# Patient Record
Sex: Female | Born: 1935 | Race: White | Hispanic: No | Marital: Married
Health system: Southern US, Community
[De-identification: ages and names within clinical notes are randomized; demographics above are authoritative.]

---

## 2004-06-28 ENCOUNTER — Ambulatory Visit: Payer: Self-pay | Admitting: Internal Medicine

## 2013-06-05 ENCOUNTER — Ambulatory Visit: Payer: Self-pay | Admitting: Ophthalmology

## 2013-07-03 ENCOUNTER — Ambulatory Visit: Payer: Self-pay | Admitting: Ophthalmology

## 2013-07-16 ENCOUNTER — Inpatient Hospital Stay: Payer: Self-pay | Admitting: Family Medicine

## 2013-07-16 DIAGNOSIS — I214 Non-ST elevation (NSTEMI) myocardial infarction: Secondary | ICD-10-CM

## 2013-07-16 LAB — URINALYSIS, COMPLETE
Bilirubin,UR: NEGATIVE
Ketone: NEGATIVE
Leukocyte Esterase: NEGATIVE
Nitrite: NEGATIVE
Ph: 5 (ref 4.5–8.0)
Specific Gravity: 1.018 (ref 1.003–1.030)
Squamous Epithelial: <1
WBC UR: 3 /HPF (ref 0–5)

## 2013-07-16 LAB — BASIC METABOLIC PANEL
BUN: 26 mg/dL — ABNORMAL HIGH (ref 7–18)
Calcium, Total: 8.9 mg/dL (ref 8.5–10.1)
Creatinine: 1.16 mg/dL (ref 0.60–1.30)
EGFR (African American): 51 — ABNORMAL LOW
Glucose: 111 mg/dL — ABNORMAL HIGH (ref 65–99)
Osmolality: 277 (ref 275–301)
Sodium: 136 mmol/L (ref 136–145)

## 2013-07-16 LAB — CBC WITH DIFFERENTIAL/PLATELET
Basophil #: 0.1 10*3/uL (ref 0.0–0.1)
Basophil %: 1.3 %
Eosinophil #: 0 10*3/uL (ref 0.0–0.7)
Eosinophil %: 0.1 %
HCT: 41.4 % (ref 35.0–47.0)
HGB: 13.8 g/dL (ref 12.0–16.0)
Lymphocyte #: 2.2 10*3/uL (ref 1.0–3.6)
Lymphocyte %: 31.2 %
MCH: 30.6 pg (ref 26.0–34.0)
MCHC: 33.3 g/dL (ref 32.0–36.0)
MCV: 92 fL (ref 80–100)
Monocyte #: 0.8 x10 3/mm (ref 0.2–0.9)
Neutrophil %: 56.5 %
RDW: 14 % (ref 11.5–14.5)
WBC: 7.1 10*3/uL (ref 3.6–11.0)

## 2013-07-16 LAB — CK: CK, Total: 5449 U/L — ABNORMAL HIGH (ref 21–215)

## 2013-07-16 LAB — CK-MB: CK-MB: 32.4 ng/mL — ABNORMAL HIGH (ref 0.5–3.6)

## 2013-07-16 LAB — RAPID INFLUENZA A&B ANTIGENS

## 2013-07-16 LAB — TROPONIN I: Troponin-I: 14 ng/mL — ABNORMAL HIGH

## 2013-07-16 LAB — APTT: Activated PTT: 35.2 secs (ref 23.6–35.9)

## 2013-07-17 LAB — URINALYSIS, COMPLETE
Bilirubin,UR: NEGATIVE
Glucose,UR: NEGATIVE mg/dL (ref 0–75)
Ketone: NEGATIVE
Protein: 30
RBC,UR: 2 /HPF (ref 0–5)
WBC UR: 15 /HPF (ref 0–5)

## 2013-07-17 LAB — CBC WITH DIFFERENTIAL/PLATELET
Basophil #: 0 10*3/uL (ref 0.0–0.1)
Eosinophil #: 0 10*3/uL (ref 0.0–0.7)
Eosinophil %: 0.4 %
HGB: 11.4 g/dL — ABNORMAL LOW (ref 12.0–16.0)
Lymphocyte %: 27.1 %
MCH: 31.2 pg (ref 26.0–34.0)
MCHC: 33.9 g/dL (ref 32.0–36.0)
MCV: 92 fL (ref 80–100)
Monocyte %: 10.7 %
Neutrophil #: 4.1 10*3/uL (ref 1.4–6.5)
Neutrophil %: 61.1 %
Platelet: 255 10*3/uL (ref 150–440)
RDW: 13.9 % (ref 11.5–14.5)
WBC: 6.7 10*3/uL (ref 3.6–11.0)

## 2013-07-17 LAB — APTT
Activated PTT: >160 secs (ref 23.6–35.9)
Activated PTT: 155 secs — ABNORMAL HIGH (ref 23.6–35.9)
Activated PTT: 112.7 secs — ABNORMAL HIGH (ref 23.6–35.9)

## 2013-07-17 LAB — LIPID PANEL
Cholesterol: 117 mg/dL (ref 0–200)
HDL Cholesterol: 34 mg/dL — ABNORMAL LOW (ref 40–60)

## 2013-07-17 LAB — COMPREHENSIVE METABOLIC PANEL
BUN: 25 mg/dL — ABNORMAL HIGH (ref 7–18)
Bilirubin,Total: 0.3 mg/dL (ref 0.2–1.0)
Chloride: 103 mmol/L (ref 98–107)
Co2: 27 mmol/L (ref 21–32)
Creatinine: 0.92 mg/dL (ref 0.60–1.30)
Glucose: 119 mg/dL — ABNORMAL HIGH (ref 65–99)
Osmolality: 276 (ref 275–301)
Potassium: 3.5 mmol/L (ref 3.5–5.1)
SGOT(AST): 207 U/L — ABNORMAL HIGH (ref 15–37)
SGPT (ALT): 66 U/L (ref 12–78)
Sodium: 135 mmol/L — ABNORMAL LOW (ref 136–145)
Total Protein: 5.6 g/dL — ABNORMAL LOW (ref 6.4–8.2)

## 2013-07-17 LAB — TSH: Thyroid Stimulating Horm: 1 u[IU]/mL

## 2013-07-17 LAB — CK-MB: CK-MB: 26.5 ng/mL — ABNORMAL HIGH (ref 0.5–3.6)

## 2013-07-17 LAB — TROPONIN I: Troponin-I: 9.9 ng/mL — ABNORMAL HIGH

## 2013-07-18 DIAGNOSIS — I214 Non-ST elevation (NSTEMI) myocardial infarction: Secondary | ICD-10-CM

## 2013-07-18 DIAGNOSIS — I10 Essential (primary) hypertension: Secondary | ICD-10-CM

## 2013-07-18 DIAGNOSIS — I359 Nonrheumatic aortic valve disorder, unspecified: Secondary | ICD-10-CM

## 2013-07-18 LAB — BASIC METABOLIC PANEL
Anion Gap: 3 — ABNORMAL LOW (ref 7–16)
BUN: 20 mg/dL — ABNORMAL HIGH (ref 7–18)
Calcium, Total: 7.8 mg/dL — ABNORMAL LOW (ref 8.5–10.1)
Chloride: 105 mmol/L (ref 98–107)
Co2: 27 mmol/L (ref 21–32)
Creatinine: 0.78 mg/dL (ref 0.60–1.30)
EGFR (African American): >60
EGFR (Non-African Amer.): >60
Glucose: 118 mg/dL — ABNORMAL HIGH (ref 65–99)
Osmolality: 274 (ref 275–301)
Potassium: 3.9 mmol/L (ref 3.5–5.1)
Sodium: 135 mmol/L — ABNORMAL LOW (ref 136–145)

## 2013-07-18 LAB — URINE CULTURE

## 2013-07-18 LAB — APTT: Activated PTT: 106.4 secs — ABNORMAL HIGH (ref 23.6–35.9)

## 2013-07-18 LAB — CK: CK, Total: 1734 U/L — ABNORMAL HIGH (ref 21–215)

## 2013-07-18 LAB — TROPONIN I: Troponin-I: 2.4 ng/mL — ABNORMAL HIGH

## 2013-07-19 ENCOUNTER — Telehealth: Payer: Self-pay

## 2013-07-19 LAB — CBC WITH DIFFERENTIAL/PLATELET
BASOS ABS: 0 10*3/uL (ref 0.0–0.1)
Basophil %: 0.6 %
Eosinophil #: 0.1 10*3/uL (ref 0.0–0.7)
Eosinophil %: 1.8 %
HCT: 29.7 % — ABNORMAL LOW (ref 35.0–47.0)
HGB: 9.9 g/dL — AB (ref 12.0–16.0)
LYMPHS ABS: 2 10*3/uL (ref 1.0–3.6)
Lymphocyte %: 32.2 %
MCH: 30.7 pg (ref 26.0–34.0)
MCHC: 33.4 g/dL (ref 32.0–36.0)
MCV: 92 fL (ref 80–100)
MONOS PCT: 10.1 %
Monocyte #: 0.6 x10 3/mm (ref 0.2–0.9)
Neutrophil #: 3.5 10*3/uL (ref 1.4–6.5)
Neutrophil %: 55.3 %
Platelet: 225 10*3/uL (ref 150–440)
RBC: 3.24 10*6/uL — ABNORMAL LOW (ref 3.80–5.20)
RDW: 13.8 % (ref 11.5–14.5)
WBC: 6.3 10*3/uL (ref 3.6–11.0)

## 2013-07-19 LAB — BASIC METABOLIC PANEL
Anion Gap: 3 — ABNORMAL LOW (ref 7–16)
BUN: 17 mg/dL (ref 7–18)
CALCIUM: 7.8 mg/dL — AB (ref 8.5–10.1)
CO2: 30 mmol/L (ref 21–32)
Chloride: 105 mmol/L (ref 98–107)
Creatinine: 0.76 mg/dL (ref 0.60–1.30)
EGFR (Non-African Amer.): >60
GLUCOSE: 96 mg/dL (ref 65–99)
Osmolality: 277 (ref 275–301)
Potassium: 3.7 mmol/L (ref 3.5–5.1)
Sodium: 138 mmol/L (ref 136–145)

## 2013-07-19 NOTE — Telephone Encounter (Signed)
Patient contacted regarding [facility] on 07/19/13.  Patient understands to follow up with provider Dr. Mariah MillingGollan on 07/26/13 at 11:00 at [facility]. Patient understands discharge instructions? yes Patient understands medications and regiment? yes Patient understands to bring all medications to this visit? yes

## 2013-07-23 ENCOUNTER — Telehealth: Payer: Self-pay

## 2013-07-23 NOTE — Telephone Encounter (Signed)
Spoke w/ pt.  She reports that she is too sick to go any appts this week. Informed her that she has not been seen in our office and am unsure as to what she is scheduled to have. Pt states repeatedly that she is scheduled for a cath at 9:00 and will not be able to make it. Called scheduling dept at [facility] and they report that pt was sched for cath on 1/2 that was cancelled but has not been rescheduled.  Pt had appt here to see Dr. Mariah MillingGollan on 1/9 that was cancelled and pt to call when she is feeling better.

## 2013-07-23 NOTE — Telephone Encounter (Signed)
Pt called, has the flu, states " there is no way Dr. Evaristo Buryan do a catherization on my as much as I am coughing." States she was returning a call to the nurse. Please call.

## 2013-07-25 ENCOUNTER — Telehealth: Payer: Self-pay | Admitting: Cardiovascular Disease

## 2013-07-25 NOTE — Telephone Encounter (Signed)
Spoke w/ pt and pt's daughter who report that pt is still feeling under the weather and coughing a lot. Reports that pt has a broken foot and, due to neuropathy, is wrapped in an ACE bandage, not a cast.   This makes it difficult to transport pt.  Reports they have been calling EMS to get her to doctor appts, but they simply cannot afford that right now.  Daughter states that they have a lot of stressors right now, as she is sick herself, her father is "an invalid" and they are having financial issues.  Daughter is upset as she states that she knows her mother needs this, but they don't think she will be able to come in for an appt and pt is still coughing too much for a cath. Offered her appt next week just to be on the schedule w/ the option of cancelling if they can't make it, but she states that she knows her mother will not be able to make it.  Daughter to call when she feels pt is up to coming in.

## 2013-07-25 NOTE — Telephone Encounter (Signed)
Can we call patient to followup after recent hospitalization She had a NSTEMI, Have resolving pneumonia/bronchitis Need to schedule a cardiac catheterization when she feels her cough has improved May be better to come into clinic for appt  to have blood work, x-ray, go over procedure

## 2013-07-26 ENCOUNTER — Ambulatory Visit: Payer: Medicare Other | Admitting: Cardiovascular Disease

## 2013-08-29 ENCOUNTER — Encounter: Payer: Self-pay | Admitting: Surgery

## 2013-09-05 ENCOUNTER — Inpatient Hospital Stay: Payer: Self-pay | Admitting: Internal Medicine

## 2013-09-05 LAB — CBC WITH DIFFERENTIAL/PLATELET
BASOS ABS: 0.2 10*3/uL — AB (ref 0.0–0.1)
BASOS PCT: 1.2 %
Eosinophil #: 0.1 10*3/uL (ref 0.0–0.7)
Eosinophil %: 0.7 %
HCT: 35.7 % (ref 35.0–47.0)
HGB: 11.9 g/dL — AB (ref 12.0–16.0)
LYMPHS ABS: 1.3 10*3/uL (ref 1.0–3.6)
Lymphocyte %: 8.2 %
MCH: 30.2 pg (ref 26.0–34.0)
MCHC: 33.5 g/dL (ref 32.0–36.0)
MCV: 90 fL (ref 80–100)
MONO ABS: 1 x10 3/mm — AB (ref 0.2–0.9)
MONOS PCT: 6.4 %
NEUTROS PCT: 83.5 %
Neutrophil #: 13 10*3/uL — ABNORMAL HIGH (ref 1.4–6.5)
PLATELETS: 727 10*3/uL — AB (ref 150–440)
RBC: 3.96 10*6/uL (ref 3.80–5.20)
RDW: 13.3 % (ref 11.5–14.5)
WBC: 15.6 10*3/uL — ABNORMAL HIGH (ref 3.6–11.0)

## 2013-09-05 LAB — BASIC METABOLIC PANEL
Anion Gap: 4 — ABNORMAL LOW (ref 7–16)
BUN: 30 mg/dL — ABNORMAL HIGH (ref 7–18)
CO2: 32 mmol/L (ref 21–32)
Calcium, Total: 9.1 mg/dL (ref 8.5–10.1)
Chloride: 94 mmol/L — ABNORMAL LOW (ref 98–107)
Creatinine: 0.94 mg/dL (ref 0.60–1.30)
EGFR (African American): >60
EGFR (Non-African Amer.): 56 — ABNORMAL LOW
Glucose: 111 mg/dL — ABNORMAL HIGH (ref 65–99)
Osmolality: 268 (ref 275–301)
Potassium: 3.2 mmol/L — ABNORMAL LOW (ref 3.5–5.1)
SODIUM: 130 mmol/L — AB (ref 136–145)

## 2013-09-05 LAB — URINALYSIS, COMPLETE
BACTERIA: NONE SEEN
BLOOD: NEGATIVE
Bilirubin,UR: NEGATIVE
Glucose,UR: NEGATIVE mg/dL (ref 0–75)
Ketone: NEGATIVE
LEUKOCYTE ESTERASE: NEGATIVE
Nitrite: NEGATIVE
Ph: 6 (ref 4.5–8.0)
Protein: NEGATIVE
RBC,UR: <1 /HPF (ref 0–5)
SPECIFIC GRAVITY: 1.02 (ref 1.003–1.030)
Squamous Epithelial: <1

## 2013-09-05 LAB — TROPONIN I: Troponin-I: 0.05 ng/mL

## 2013-09-05 LAB — PRO B NATRIURETIC PEPTIDE: B-Type Natriuretic Peptide: 949 pg/mL — ABNORMAL HIGH (ref 0–450)

## 2013-09-06 DIAGNOSIS — I5043 Acute on chronic combined systolic (congestive) and diastolic (congestive) heart failure: Secondary | ICD-10-CM

## 2013-09-06 DIAGNOSIS — I499 Cardiac arrhythmia, unspecified: Secondary | ICD-10-CM

## 2013-09-06 DIAGNOSIS — I214 Non-ST elevation (NSTEMI) myocardial infarction: Secondary | ICD-10-CM

## 2013-09-06 LAB — HEPATIC FUNCTION PANEL A (ARMC)
ALBUMIN: 2 g/dL — AB (ref 3.4–5.0)
Alkaline Phosphatase: 89 U/L
Bilirubin, Direct: 0.1 mg/dL (ref 0.00–0.20)
Bilirubin,Total: 0.3 mg/dL (ref 0.2–1.0)
SGOT(AST): 38 U/L — ABNORMAL HIGH (ref 15–37)
SGPT (ALT): 20 U/L (ref 12–78)
TOTAL PROTEIN: 6.2 g/dL — AB (ref 6.4–8.2)

## 2013-09-06 LAB — CBC WITH DIFFERENTIAL/PLATELET
BASOS PCT: 0.5 %
Basophil #: 0.1 10*3/uL (ref 0.0–0.1)
Eosinophil #: 0.3 10*3/uL (ref 0.0–0.7)
Eosinophil %: 2.1 %
HCT: 30.3 % — AB (ref 35.0–47.0)
HGB: 10.3 g/dL — ABNORMAL LOW (ref 12.0–16.0)
LYMPHS ABS: 1.5 10*3/uL (ref 1.0–3.6)
Lymphocyte %: 10.3 %
MCH: 30.5 pg (ref 26.0–34.0)
MCHC: 34.2 g/dL (ref 32.0–36.0)
MCV: 89 fL (ref 80–100)
Monocyte #: 0.9 x10 3/mm (ref 0.2–0.9)
Monocyte %: 6 %
Neutrophil #: 11.9 10*3/uL — ABNORMAL HIGH (ref 1.4–6.5)
Neutrophil %: 81.1 %
Platelet: 654 10*3/uL — ABNORMAL HIGH (ref 150–440)
RBC: 3.39 10*6/uL — AB (ref 3.80–5.20)
RDW: 13.7 % (ref 11.5–14.5)
WBC: 14.6 10*3/uL — AB (ref 3.6–11.0)

## 2013-09-06 LAB — BASIC METABOLIC PANEL
Anion Gap: 5 — ABNORMAL LOW (ref 7–16)
BUN: 27 mg/dL — ABNORMAL HIGH (ref 7–18)
CALCIUM: 8.1 mg/dL — AB (ref 8.5–10.1)
CHLORIDE: 95 mmol/L — AB (ref 98–107)
Co2: 31 mmol/L (ref 21–32)
Creatinine: 1 mg/dL (ref 0.60–1.30)
EGFR (Non-African Amer.): 52 — ABNORMAL LOW
Glucose: 107 mg/dL — ABNORMAL HIGH (ref 65–99)
Osmolality: 268 (ref 275–301)
POTASSIUM: 3.3 mmol/L — AB (ref 3.5–5.1)
Sodium: 131 mmol/L — ABNORMAL LOW (ref 136–145)

## 2013-09-06 LAB — TSH: THYROID STIMULATING HORM: 0.493 u[IU]/mL

## 2013-09-08 LAB — VANCOMYCIN, TROUGH: Vancomycin, Trough: 12 ug/mL (ref 10–20)

## 2013-09-09 ENCOUNTER — Ambulatory Visit: Payer: Self-pay | Admitting: Internal Medicine

## 2013-09-10 LAB — CULTURE, BLOOD (SINGLE)

## 2013-09-15 ENCOUNTER — Ambulatory Visit: Payer: Self-pay | Admitting: Internal Medicine

## 2013-09-15 ENCOUNTER — Encounter: Payer: Self-pay | Admitting: Surgery

## 2013-10-16 DEATH — deceased

## 2014-11-07 NOTE — Consult Note (Signed)
General Aspect Reason for consultation:  Elevated troponin   Present Illness The patient has no prior cardiac history.  She lives at home and is the primary caregiver for her husband. She is limited by neuropathy and leg weakness and gets around for the most part in a motorized chair.  She reports that Sunday while getting ready for bed she fell.  She did not have syncope.  She tried all night long to get herself up but she could not get up until the AM when she called her daughter who called EMS.  The patient refused transfer to the ER.  However, she was so weak following this that her husbands home health nurse called the primary MD and he insisted that she go to the ER.  She has not had any complaints of chest pain or new SOB.  She has had a nonproductive cough with some soreness when coughing.  She has not had fever or chills.  She sleeps with her head slightly elevated and this has not changed.  She has had no palpitations, presyncope or syncope.  She has had no weight gain or edema.  In the ER her EKG was nonacute.  However, her troponin was 14.  CK was elevated at 5000.  However, the MB ratio was not diagnosic.     PMH Idiopathic peripheral neuropathy HTN  PSH Cataract Appendectomy Bilateral knee surgery TA  Social Never smoked  FH Markedly positive for early CAD in a son, multiple brothers and her father   Physical Exam:  GEN well developed, no acute distress, Pain from ankle injury   HEENT PERRL, dentures   NECK supple  No masses   RESP normal resp effort  clear BS  no use of accessory muscles   CARD Regular rate and rhythm  Normal, S1, S2  No murmur   ABD denies tenderness  denies Flank Tenderness  no liver/spleen enlargement  no Abdominal Bruits   LYMPH negative neck   EXTR negative cyanosis/clubbing, negative edema, Injury right ankle   SKIN normal to palpation, Ulcer on the left heal   NEURO cranial nerves intact   PSYCH alert, good insight   Review of  Systems:  Subjective/Chief Complaint Constipation.  Otherwised as stated in the HPI and negative for all other systems.   Home Medications: Medication Instructions Status  prednisoLONE ophthalmic acetate 1% ophthalmic suspension 1 drop(s) to operative eye 4 times a day Active  cloNIDine 0.2 mg oral tablet 1 tab(s) orally once a day (at bedtime) Active  rOPINIRole 3 mg oral tablet 1 tab(s) orally once a day (at bedtime) Active  zolpidem 10 mg oral tablet 1 tab(s) orally once a day (at bedtime), As Needed for sleep Active  diazepam 5 mg oral tablet 1 tab(s) orally 2 times a day, As Needed - for Pain Active  omeprazole 20 mg oral delayed release capsule 1 cap(s) orally 2 times a day Active  ramipril 10 mg oral capsule 1 cap(s) orally once a day Active  aMILoride-hydrochlorothiazide 5 mg-50 mg oral tablet 1 tab(s) orally once a day Active  traMADol 50 mg oral tablet 1-2 tab(s) orally every 6 hours, As Needed - for Pain Active  gabapentin 400 mg oral capsule 1 cap(s) orally once a day (in the morning), 1 capsule every afternoon, and 3 capsules at bedtime Active  Klor-Con 8 mEq oral tablet, extended release 1 tab(s) orally once a day Active   Lab Results: Routine Micro:  30-Dec-14 15:40   Micro Text  Report INFLUENZA A+B ANTIGENS   COMMENT                   NEGATIVE FOR INFLUENZA A (ANTIGEN ABSENT)   COMMENT                   NEGATIVE FOR INFLUENZA B (ANTIGEN ABSENT)   ANTIBIOTIC                       Comment 1.. NEGATIVE FOR INFLUENZA A (ANTIGEN ABSENT) A negative result does not exclude influenza. Correlation with clinical impression is required.  Comment 2.. NEGATIVE FOR INFLUENZA B (ANTIGEN ABSENT)  Result(s) reported on 16 Jul 2013 at 05:00PM.  Routine Chem:  30-Dec-14 15:47   Glucose, Serum  111  BUN  26  Creatinine (comp) 1.16  Sodium, Serum 136  Potassium, Serum 3.8  Chloride, Serum 99  CO2, Serum 29  Calcium (Total), Serum 8.9  Anion Gap 8  Osmolality (calc) 277  eGFR  (African American)  51  eGFR (Non-African American)  44 (eGFR values <52m/min/1.73 m2 may be an indication of chronic kidney disease (CKD). Calculated eGFR is useful in patients with stable renal function. The eGFR calculation will not be reliable in acutely ill patients when serum creatinine is changing rapidly. It is not useful in  patients on dialysis. The eGFR calculation may not be applicable to patients at the low and high extremes of body sizes, pregnant women, and vegetarians.)  Result Comment TROPONIN - RESULTS VERIFIED BY REPEAT TESTING.  - DR. WJimmye NormanNOTIFIED @1706  07/16/13  - BY [facility]  - READ-BACK PROCESS PERFORMED.  Result(s) reported on 16 Jul 2013 at 04:35PM.  Cardiac:  30-Dec-14 15:47   CPK-MB, Serum  32.4 (Result(s) reported on 16 Jul 2013 at 06:01PM.)  CK, Total  5449 (Result(s) reported on 16 Jul 2013 at 06:01PM.)  Troponin I  14.00 (0.00-0.05 0.05 ng/mL or less: NEGATIVE  Repeat testing in 3-6 hrs  if clinically indicated. >0.05 ng/mL: POTENTIAL  MYOCARDIAL INJURY. Repeat  testing in 3-6 hrs if  clinically indicated. NOTE: An increase or decrease  of 30% or more on serial  testing suggests a  clinically important change)  Routine UA:  30-Dec-14 15:47   Color (UA) Yellow  Clarity (UA) Clear  Glucose (UA) Negative  Bilirubin (UA) Negative  Ketones (UA) Negative  Specific Gravity (UA) 1.018  Blood (UA) 1+  pH (UA) 5.0  Protein (UA) 100 mg/dL  Nitrite (UA) Negative  Leukocyte Esterase (UA) Negative (Result(s) reported on 16 Jul 2013 at 05:32PM.)  RBC (UA) 1 /HPF  WBC (UA) 3 /HPF  Bacteria (UA) TRACE  Epithelial Cells (UA) <1 /HPF  Hyaline Cast (UA) 3 /LPF (Result(s) reported on 16 Jul 2013 at 05:32PM.)  Routine Hem:  30-Dec-14 15:47   WBC (CBC) 7.1  RBC (CBC) 4.50  Hemoglobin (CBC) 13.8  Hematocrit (CBC) 41.4  Platelet Count (CBC) 340  MCV 92  MCH 30.6  MCHC 33.3  RDW 14.0  Neutrophil % 56.5  Lymphocyte % 31.2  Monocyte % 10.9  Eosinophil  % 0.1  Basophil % 1.3  Neutrophil # 4.0  Lymphocyte # 2.2  Monocyte # 0.8  Eosinophil # 0.0  Basophil # 0.1 (Result(s) reported on 16 Jul 2013 at 04:16PM.)   EKG:  EKG Interp. by me  NSR  Poor anterior R wave progression.  No acute ST T wave changes   Rate 95   Radiology Results: XRay:    30-Dec-14  17:30, Ankle Right Complete  Ankle Right Complete   REASON FOR EXAM:    fall, ankle pain and swelling  COMMENTS:       PROCEDURE: DXR - DXR ANKLE RIGHT COMPLETE  - Jul 16 2013  5:30PM     CLINICAL DATA:  Status post fall with ankle pain and swelling    EXAM:  RIGHT ANKLE - COMPLETE 3+ VIEW    COMPARISON:  None.    FINDINGS:  There is an oblique fracture of the distal fibular metaphysis  without significant displacement or angulation. There is severe  overlying soft tissue swelling.    There is generalized osteopenia. There is no other fracture. There  is no dislocation. The ankle mortise is intact. There is pes planus.  There is a plantar calcaneal spur.     IMPRESSION:  Nondisplaced oblique fracture of the right distal fibular metaphysis  with severe overlying soft tissue swelling.      Electronically Signed    By: Kathreen Devoid    On: 07/16/2013 17:32     Verified By: Jennette Banker, M.D., MD    30-Dec-14 17:30, Chest PA and Lateral  Chest PA and Lateral   REASON FOR EXAM:    weakness  COMMENTS:       PROCEDURE: DXR - DXR CHEST PA (OR AP) AND LATERAL  - Jul 16 2013  5:30PM     CLINICAL DATA:  Weakness    EXAM:  CHEST  2 VIEW    COMPARISON:  None.    FINDINGS:  There is elevation of the right diaphragm.There is no focal  parenchymal opacity, pleural effusion, or pneumothorax. The heart  and mediastinal contours are unremarkable.    There is a moderate size hiatal hernia.    The osseous structures are unremarkable.     IMPRESSION:  1. No active cardiopulmonary disease.  2. Moderate-sized hiatal hernia.      Electronically Signed    By: Kathreen Devoid    On: 07/16/2013 17:31     Verified By: Jennette Banker, M.D., MD    30-Dec-14 17:30, Sacrum and Coccyx  Sacrum and Coccyx   REASON FOR EXAM:    fall, sacral pain  COMMENTS:       PROCEDURE: DXR - DXR SACRUM AND COCCYX  - Jul 16 2013  5:30PM     CLINICAL DATA:  Fall, sacral pain    EXAM:  SACRUM AND COCCYX - 2+ VIEW    COMPARISON:  None.    FINDINGS:  Study limited by osteopenia and overlying bowel contents. Detail of  the sacrum is therefore limited. There is mild fecal impaction in  the rectum. Allowing for the findings described above, a sacral  fracture is not appreciated. However, there does appear to be an  expansile corticated lytic sacral lesion measuring 35 x 93 mm  involving the posterior superior half of the sacrum.     IMPRESSION:  No evidence of acute traumatic injury but there does appear to be an  expansile lesion involving the sacrum. This could be better  evaluated with CT scan.      Electronically Signed    By: Skipper Cliche M.D.    On: 07/16/2013 17:49     Verified By: Rachael Fee, M.D.,    Duragesic: GI Distress  Codeine: Other  Neo-Synephrine Nasal: Other  Sulfa drugs: Unknown  IVP Dye: Unknown  Barbiturates: Unknown   Plan NQWMI:  This troponin elevation is markedly elevated above  what I would expect with her CK elevation from trauma/fall.  However, she has no acute ST T wave changes.  She has no chest pain or SOB.  I would start ASA and heparin.  Continue to cycle enymes including CKMB.  She will need an echocardiogram.  I would suggest cardiac cath before discharge to rule out obstructive CAD.   Electronic Signatures: Minus Breeding (MD)  (Signed 30-Dec-14 19:01)  Authored: General Aspect/Present Illness, History and Physical Exam, Review of System, Home Medications, Labs, EKG , Radiology, Allergies, Impression/Plan   Last Updated: 30-Dec-14 19:01 by Minus Breeding (MD)

## 2014-11-07 NOTE — Consult Note (Signed)
Brief Consult Note: Diagnosis: right lateral malleolus fracture.   Patient was seen by consultant.   Comments: No surgery required as long as it does not displace.  Patient likely to have difficulty remaining off the foot with her neuropathy. PT order placed if ok from cardiac standpoint.  Electronic Signatures: Leitha SchullerMenz, Quentina Fronek J (MD)  (Signed 31-Dec-14 15:04)  Authored: Brief Consult Note   Last Updated: 31-Dec-14 15:04 by Leitha SchullerMenz, Adrina Armijo J (MD)

## 2014-11-08 NOTE — Discharge Summary (Signed)
PATIENT NAME:  Dawn Coleman, Dawn Coleman MR#:  [id number] DATE OF BIRTH:  [birth year]  DATE OF ADMISSION:  07/16/2013 DATE OF DISCHARGE:  07/19/2013   REASON FOR ADMISSION: Status post fall and fracture of the right ankle.   DISCHARGE DIAGNOSES:  1. Right ankle fracture.  2. Non-ST-elevation myocardial infarction secondary to stress from the fracture. 3. History of hypertension.  4. Neuropathy.  5. Chronic pain.  6. GERD.  7. Wheelchair-ridden.  8. Difficulty ambulating.  9. Rhabdomyolysis.  10. Hyponatremia.  11. Hypomagnesemia.  12. Anemia, dilutional, acute.   DISPOSITION: Home with 24-hour sitter. The patient was offered to do skilled nursing facility due to her limitations, although the patient is not comfortable with that. She does not want to, and her family has agreed to have 24-hour care. The patient has a very low functional status anyway as she gets around in a motorized chair. The patient only does transfers to the bed or to the toilet by herself, and at this moment, we are not trying to improve any of her mobility or stamina issues. We just want to work on safety, and having a 24-hour sitter will be safe enough.   MEDICATIONS AT DISCHARGE: 1. Prednisolone ophthalmic 1 drop 4 times a day.  2. Clonidine 0.2 mg at bedtime.  3. Ropinirole 2 mg at bedtime. 4. Ambien 5 mg at bedtime.  5. Diazepam 5 mg twice daily.  6. Omeprazole 20 mg twice daily. 7. Ramipril 10 mg once a day.  8. Tramadol 1 to 2 every 6 hours as needed for pain.  9. Gabapentin 400 mg once a day and 3 capsules at bedtime. 10. Klor-Con 8 mEq once a day.  11. Timolol 2 times a day on affected eye. 12. Nystatin apply to affected area.  13. Atorvastatin 20 mg at bedtime, starting in 5 days. This has been held because of rhabdomyolysis.  14. Aspirin 81 mg daily. 15. Plavix 75 mg daily.  16. Metoprolol 25 mg twice daily.   FOLLOWUP: Julien Nordmann, Dr. Kennedy Bucker, [place] in the next 2 weeks.   ACTIVITY: No  weight-bearing to the right lower extremity until cleared by Orthopedics.   HOSPITAL COURSE: This is a very nice 81 year old female who was admitted on 07/16/2013 with a history of falling. The patient is wheelchair-bound due to her severe neuropathy. Apparently, she has been not feeling very well with some cough and getting weak, and she tripped and fell in her bathroom while transferring from the wheelchair down to the toilet and coming out of the bathroom. She can do a couple of steps, maybe 3 to 4 sometimes, to get to the wheelchair, but this time, she fell on the floor and she could not get up. The patient was brought to the Emergency Department, and she was found to have a non-ST elevation MI and a fracture of the right ankle.   As far as medical problems go: 1. Non-ST elevation myocardial infarction. The patient was admitted for the treatment and evaluation of this condition. On her echocardiogram, the ejection fraction was found to be 45% to 50%, with systolic and diastolic failure. The patient is started on ACE inhibitor, beta blocker, aspirin. She was kept on heparin, and she is off heparin right now. Her diastolic and systolic ejection failure is compensated. She never was in any significant distress. The patient was planned to have a catheterization here in house, but Dr. Mariah Milling decided to do it outpatient. For now, she is going to be discharged  on aspirin, beta blocker, ACE inhibitor. A statin to be started in 5 days, is being held due to her rhabdomyolysis. The patient is stable for discharge. She was never symptomatic, and this is likely secondary to acute rate of muscle breakdown, and the patient did not have any significant chest pain.  2. Fibular fracture. Ortho to follow up. No weightbearing.  3. Hypertension. Continue beta blocker. ACE inhibitor okay to start again.  4. Neuropathy, chronic pain. Continue home medications.  5. The patient is unsafe to stand up and move around, for which a  24-hour sitter is going to be recommended.   FOLLOWUP: With Ortho, Dr. Mariah MillingGollan and primary care physician.   TIME SPENT: I spent about 45 minutes with this discharge.   ____________________________ Felipa Furnaceoberto Sanchez Gutierrez, MD rsg:lb D: 07/19/2013 10:53:54 ET T: 07/19/2013 11:14:49 ET JOB#: [id number]  cc: Felipa Furnaceoberto Sanchez Gutierrez, MD, <Dictator> Chace Bisch Juanda ChanceSANCHEZ GUTIERRE MD ELECTRONICALLY SIGNED 08/09/2013 1:02

## 2014-11-08 NOTE — H&P (Signed)
PATIENT NAME:  Dawn Coleman, [name] MR#:  [id number] DATE OF BIRTH:  10/30/1928  DATE OF ADMISSION:  07/16/2013  PRIMARY CARE PHYSICIAN: Dale Durhamharlene Scott.   REFERRING PHYSICIAN: Dr. Daryel NovemberJonathan Williams.   CHIEF COMPLAINT: Status post fall and fracture of the right ankle.   HISTORY OF PRESENT ILLNESS: This is a very nice 81 year old female who has a history of idiopathic peripheral neuropathy. She is wheelchair bound, and she has hypertension, restless leg syndrome and GERD. The patient comes today with a history of being a little bit short of breath with cough for the past 1 to 2 weeks. She gets around on a power chair, and since she has been coughing, she has been a little bit week. On Sunday, she was getting ready to go to bed. She came out of the bathroom, and she got up her power chair. For a while, she was walking a couple of steps, but unfortunately, she turned off the light, tripped and fell and landed up on the floor. She has a Engineer, miningLifeline alert, but she was not wearing it at that moment, for what she spent all night crawling to get to the bed. Her husband there, but he is bedridden and he was not able to do anything for her. The patient was able to get in bed and in the morning get up into the motorized scooter and she was doing her normal things. Just had some soreness of the right ankle. The patient's son came back from out of town in the afternoon today and saw her and brought her into the Emergency Department. The patient states she has been wheezing, but she has not had any fever. She has been just feeling cold. In the Emergency Department, evaluation showed that the patient had a nondisplaced oblique fracture of the right distal tibial metaphysis with severe overlying soft tissue swelling. The patient also was found to have a significant elevation of troponin up to 14. She does have a little bit of rhabdomyolysis with a CK of 5400 but not kidney failure. Her lungs looked good without any signs of  congestive heart failure. Cardiology has seen the patient. They recommended full treatment for non-ST elevation MI and possible evaluation with heart catheterization in the morning. The patient is admitted for treatment and evaluation of this condition.   REVIEW OF SYSTEMS:   CONSTITUTIONAL: No fever, fatigue, weight loss or weight gain.  EYES: No blurry vision, double vision. The patient had cataract surgery recently.  EARS, NOSE, THROAT: No difficulty swallowing. No tinnitus.  RESPIRATORY: Positive cough for 2 weeks but no fever. No sputum; just dry cough.   CARDIOVASCULAR: No chest pain, orthopnea, paroxysmal nocturnal dyspnea, palpitations or syncope.  GASTROINTESTINAL: No nausea, vomiting, abdominal pain, constipation, diarrhea.  GENITOURINARY: No dysuria, hematuria, changes in frequency. She has not been able to void today.  GYNECOLOGIC: No breast masses.  ENDOCRINOLOGY: No polyuria, polydipsia, polyphagia.  HEMATOLOGIC AND LYMPHATIC: No anemia, easy bruising or bleeding.  SKIN: No rashes or petechiae.  MUSCULOSKELETAL: Positive for peripheral neuropathy with severe pains, and this is idiopathic. She also has pain in the right ankle.   NEUROLOGIC: No numbness or tingling. Positive periphery neuropathy which gives her burning pains most of the time.  PSYCHIATRIC: No anxiety or depression at this moment.   PAST MEDICAL HISTORY:  1. Idiopathic peripheral neuropathy.  2. Wheelchair bound due to this condition.  3. Hypertension.  4. Restless leg syndrome.  5. GERD.   PAST SURGICAL HISTORY:  1. Cataract surgery.  2. Appendectomy.  3. Bilateral knee surgery.  4. Mole removed from the abdomen.  5. Great toe fusion on the right side.   SOCIAL HISTORY: The patient has never smoked. Does not drink. She lives with her husband where she is the primary caregiver of her husband despite the fact that she has multiple limitations like she only gets around in a wheelchair. Her husband is  completely bedbound at 100% assistance. She does have some help with 3 people coming in to help.   FAMILY HISTORY: Positive for coronary artery disease in her son who had a CABG at the age of 79. Her father had 3 MIs and a CVA. Her mother had breast cancer.   PHYSICAL EXAMINATION:  VITAL SIGNS: Blood pressure 151/70, pulse between 89 and 97, temperature 98.4, respirations 20, pulse ox of 99% on room air.  GENERAL: The patient is alert and oriented x 3, in no acute distress at this moment, but she is teary-eyed. No significant agitation.  HEENT: Her pupils are equal and reactive. Extraocular movements are intact. Mucosa is moist. Anicteric sclerae. Pink conjunctivae. No oral lesions. No oropharyngeal exudates.  NECK: Supple. No JVD. No thyromegaly. No adenopathy. No carotid bruits. No rigidity.  CARDIOVASCULAR: Regular rate and rhythm. No murmurs, rubs or gallops are appreciated at this moment. No displacement of PMI.  LUNGS: Show moderate decrease of respiratory sounds in the bases, but overall they are clear without any use of accessory muscles. No dullness to percussion.  ABDOMEN: Soft, non tender , nondistended. No hepatosplenomegaly. No masses. Bowel sounds are positive. There is mild tenderness and discomfort on palpation of the suprapubic area due to the patient has not been urinating.   GENITAL: Negative for external lesions.  EXTREMITIES: Significant edema of the right lower extremity at the level of the ankle. No cyanosis or clubbing.  VASCULAR: Pulses +2. Capillary refill less than 3.  SKIN: No rashes, petechiae or new lesions.   LYMPHATIC: Negative for lymphadenopathy in the clavicular or supraclavicular areas.  MUSCULOSKELETAL: Deformity at the level of the right ankle with internal rotation and edema. The patient does not seem to be too tender to palpation at that area at the moment, but she is tender with mobilization.  NEUROLOGIC: Cranial nerves II through XII intact. Strength seems  to be equal in 4 extremities. Sensation seems to be decreased in lower extremities.  PSYCHIATRIC: The patient seems to be overall okay with occasional teary-eyed whenever we talk about her conditions and about the condition that her husband is in, but overall she is not agitated and she is alert, oriented x 3.   LABORATORY WORK: Shows a troponin of 14, CK of 5400. Her glucose is 111, BUN is 26, sodium 136, potassium 3.8. White count is 7.1, hemoglobin 13.8, platelet count 340.   Influenza is negative. Urinalysis: No signs of urinary tract infection.   EKG: No significant ST elevations. There is poor R wave progression at the level of the septal areas. Some flattening of T waves and maybe some inversion at the level of V6, V5. Low voltage QRS.   X-rays: As mentioned above, the fracture. Chest x-ray without any acute abnormalities. Positive hiatal hernia.   ASSESSMENT AND PLAN: This is a very nice 81 year old female with history of idiopathic neuropathy who comes with a fracture of the ankle and significant elevation of the troponin and CK. This seems to be very disproportional.  1. Non-ST elevation myocardial infarction: The patient has significantly marked elevation of her  troponin. It would be expected due to the trauma and elevation of CK. The patient has maybe small rhabdomyolysis, breakdown of the tissue due to the fall and the fracture, although we would not expect the troponin to be this high. Very likely the patient has underlying coronary artery disease as her EKG shows significant decreased Q waves and very poor progression of the QRS in the septal leads. Another possibility is that the patient has been worrying a lot and had Takotsubo. This is Dr. Fayrene Fearing Hochrein's theory who is the cardiologist, and his Nelva Bush would be that she might have Takotsubo and her heart was under so much stress that she had a non-ST elevation myocardial infarction. Overall, the patient is going to be treated for this  condition with a heparin drip, beta blockers, aspirin, morphine and p.r.n. nitroglycerin. At this moment, the patient is asymptomatic, but she could have a silent myocardial infarction since the patient has a history of idiopathic neuropathy. The patient is hemodynamically stable, but she is probably going to undergo a cardiac catheterization in the morning. Echocardiogram has been ordered.  2. Fracture of the right ankle: The patient will need to be evaluated by orthopedics. At this moment, even if the treatment is surgical, she cannot undergo surgery for this. She will need to be on bedrest for what we are going to put a Foley catheter. Immobilization of the right lower extremity with elevation. Orthopedic surgeon consulted for further recommendations. Pain control given with the morphine.  3. Mild rhabdomyolysis: This is likely secondary to the patient being on the floor for several hours and the fall and broken bones. The patient is going to be given intravenous fluids at 100 mL an hour. I do not want to overload her with fluid since she is asymptomatic and her kidney function is normal, but we are going to monitor her kidney function very closely. Check a CK level in the morning.  4. Idiopathic peripheral neuropathy: Treat with some medication. She is taking pain control, gabapentin. At this moment, that is stable.  5. Restless leg syndrome: Continue general treatment.  6. Gastroesophageal reflux disease: Continue treatment with proton pump inhibitor. We are going to do double dose proton pump inhibitor as the patient is going to be on aspirin and heparin.  7. Hypertension: Seems to be stable. The patient is going to be taking a beta blocker for her non-ST elevation myocardial infarction. Continue ramipril as her kidney function is normal and clonidine as needed.  8. Deep vein thrombosis prophylaxis: Has been given with heparin drip.   The patient is a FULL CODE.   TIME SPENT: I spent about 60  minutes with this patient today.   ____________________________ Felipa Furnace, MD rsg:gb D: 07/16/2013 20:43:02 ET T: 07/16/2013 21:04:06 ET JOB#: [id number]  cc: Felipa Furnace, MD, <Dictator> Crist Kruszka Juanda Chance MD ELECTRONICALLY SIGNED 08/11/2013 7:55

## 2014-11-08 NOTE — H&P (Signed)
PATIENT NAME:  Dawn Coleman, Dawn Coleman MR#:  [id number] DATE OF BIRTH:  10/26/1928  DATE OF ADMISSION:  09/05/2013  PRIMARY CARE PROVIDER: Dale Durhamharlene Scott, MD  EMERGENCY DEPARTMENT REFERRING PHYSICIAN: Kathreen DevoidKevin A. Paduchowski, MD  CHIEF COMPLAINT: Weakness, shortness of breath.   HISTORY OF PRESENT ILLNESS: The patient is an 81 year old Caucasian female with history of idiopathic neuropathy, who mostly moves around in a power chair, who was recently hospitalized on 07/16/2013 and was subsequently discharged in January after she had sustained a fall and a right ankle fracture. At that time, the patient was kept in the hospital. She was noted to have non-ST MI. Also had an echocardiogram that showed diastolic dysfunction and was discharged home, and the patient has been living with her husband with close family nearby as well as some home health. The patient, since yesterday, has been more lethargic than usual. Has also been having respiratory difficulties, according to her daughter. The patient was brought into the ED and was noted to be hypoxic at 85%. Chest x-ray suggestive of pneumonia. She is currently denying any chest pain or palpitations. Has not had any cough, but feel short of breath. Denies any abdominal pain, nausea, vomiting or diarrhea. Denies any urinary symptoms. She also has multiple wounds on her legs and is being followed by wound care and has these debrided.   PAST MEDICAL HISTORY: Significant for:  1. Idiopathic peripheral neuropathy, wheelchair-bound due to chronic neuropathy.  2. Recent ankle fracture.  3. Recent history of MI.  4. Hypertension.  5. Restless leg syndrome.  6. GERD.  7. Diastolic dysfunction.   PAST SURGICAL HISTORY: Status post cataract surgery, status post appendectomy and bilateral knee surgery, mole removed from the abdomen, great toe fusion on the right side.   SOCIAL HISTORY: Does not smoke. Does not drink. Lives with her husband.   FAMILY HISTORY: Positive  for coronary artery disease in her son, who had CABG at age 79. Father had 3 MIs. Mother had breast cancer.   REVIEW OF SYSTEMS:  CONSTITUTIONAL: Complains of generalized weakness. No weight gain or weight loss.  HEENT: Denies any blurred vision or double vision. Has a history of glaucoma.  ENT: No tinnitus. No ear pain. No hearing loss. No seasonal or year-round allergies. No epistaxis. No difficulty swallowing.  RESPIRATORY: Has no cough. No wheezing. Complains of shortness of breath.  CARDIOVASCULAR: Denies any chest pain, orthopnea. Does not have edema. No arrhythmias.  GASTROINTESTINAL: No nausea, vomiting, diarrhea. No abdominal pain. No hematemesis. No melena. No ulcer.  GENITOURINARY: Denies any dysuria, hematuria, renal calculus or frequency.  ENDOCRINE: Denies any polyuria or nocturia.  HEMATOLOGIC AND LYMPHATIC: Denies anemia, easy bruisability or bleeding.  SKIN: No acne. Has multiple wounds on her legs.  MUSCULOSKELETAL: No pain in the neck, back or shoulder.  NEUROLOGICAL: Has severe neuropathy, which is idiopathic. No CVA or TIA.  PSYCHIATRIC: No anxiety. No insomnia. No ADD or OCD.   PHYSICAL EXAMINATION:  VITAL SIGNS: Temperature 98.3, pulse 85, respirations 16, blood pressure 154/63, O2 85%. GENERAL: The patient is a morbidly obese female, appears chronically ill.  HEENT: Head atraumatic, normocephalic. Pupils equally round and reactive to light and accommodation. There is no conjunctival pallor. No scleral icterus. Nasal exam shows no drainage or ulceration. Oropharynx is clear, without any exudate.  NECK: Supple, without any JVD.  CARDIOVASCULAR: Irregularly irregular rhythm. No murmurs, rubs, clicks or gallops.  LUNGS: She has some rhonchi throughout both lungs. She does have some accessory muscle usage. No  wheezing.  ABDOMEN: Soft, nontender, nondistended. Positive bowel sounds x4.  EXTREMITIES: She has got no clubbing or cyanosis. Nonpitting edema.  SKIN: She has  multiple wounds which are dressed.  NEUROLOGICAL: Is a little lethargic, but able to answer questions.  PSYCHIATRIC: Not anxious or depressed.   EVALUATIONS: Chest x-ray shows bilateral diffuse interstitial airspace disease with left greater than right, may represent edema. Glucose 111, BUN 30, creatinine 0.94, sodium 130, potassium 3.2, chloride 94, CO2 is 32, calcium 9.1. Troponin 0.05. WBC 15.6, hemoglobin 11.9, platelet count 727. Urinalysis is negative. CT scan of the head without contrast shows no acute abnormality, moderate atrophy, remote lacunar infarcts.   ASSESSMENT AND PLAN: The patient is an 81 year old white female, wheelchair-bound mostly, who presents with weakness, hypoxia.   1. Generalized weakness, likely due to pneumonia. At this time, will treat her pneumonia. PT evaluation and treatment.  2. Acute respiratory failure, likely due to pneumonia. Congestive heart failure is a possibility as well. Will give her low-dose Lasix. In light of her being recently hospitalized, will treat her for hospital-acquired pneumonia. If her creatinine increases with the Lasix, I would stop the Lasix. 3. Multiple wounds. Will have to get wound care consult. Continue current treatment as doing at home.  4. Peripheral neuropathy. Continue Neurontin.  5. Atrial fibrillation noted on EKG, which is new onset. Will ask cardiology to see. Not an anticoagulation candidate due to fall risk. Will treat with low-dose metoprolol.  6. Miscellaneous. Will do Lovenox for deep vein thrombosis prophylaxis.   TIME SPENT ON THIS PATIENT: 55 minutes.    ____________________________ Lacie Scotts Allena Katz, MD shp:lb D: 09/05/2013 14:34:45 ET T: 09/05/2013 14:51:14 ET JOB#: [id number]  cc: Lorita Forinash H. Allena Katz, MD, <Dictator> Charise Carwin MD ELECTRONICALLY SIGNED 09/06/2013 14:44

## 2014-11-08 NOTE — Discharge Summary (Signed)
PATIENT NAME:  Dawn Coleman, MAST MR#:  [id number] DATE OF BIRTH:  11-05-1928  DATE OF ADMISSION:  09/05/2013 DATE OF DISCHARGE:  09/09/2013  PRESENTING COMPLAINT: Shortness of breath and weakness.   DISCHARGE DIAGNOSES:  1. Bilateral pneumonia.  2. Respiratory failure secondary to congestive heart failure, acute on chronic systolic with bilateral pneumonia.  3. Severe bilateral lower extremity chronic ulcers with severe peripheral neuropathy.  4. History of non-Q-wave myocardial infarction in the past with ischemic cardiomyopathy.   CODE STATUS: No code, DNR.   DISCHARGE MEDICATIONS: 1. Prednisolone ophthalmic drops 1% one drop to operative eye 4 times a day.  2. Ropinirole 3 mg 1 p.o. at bedtime.  3. Diazepam 5 mg b.i.d. as needed.  4. Omeprazole 20 mg 1 p.o. b.i.d.  5. Ramipril 10 mg p.o. daily.  6. Tramadol 50 mg 1 to 2 orally every 6 hours as needed.  7. Gabapentin 400 mg 1 capsule once a day in the morning, 1 capsule every afternoon and 3 capsules at bedtime.  8. Klor-Con 8 mEq p.o. daily.  9. Timolol ophthalmic drops 0.5% 1 drop to affected eye twice a day.  10. Aspirin 81 mg daily.  11. Plavix 75 mg daily.  12. Metoprolol 25 mg b.i.d.  13. Acetaminophen/oxycodone 325/5 one tablet at bedtime.  14. Acetaminophen/oxycodone 325/5 half tablet every 4 to 6 hours as needed.  16. Collagenase 250 units/gram topical ointment to affected area daily.  17. Morphine 20 mg/mL 0.25 mL every 1 to 2 hours.  16. Ceftin 500 mg p.o. b.i.d.  17. Levaquin 500 mg p.o. daily.  The patient advised to hold amiloride/hydrochlorothiazide for now.   FOLLOWUP:  1. Hospice to follow.  2. Follow up with Dr. Einar Pheasant in 1 to 2 weeks.   CONSULTATION:  1. Palliative care consultation with Dr. Ermalinda Memos.  2. Cardiology consultation with Dr. Rockey Situ.  3. Pulmonary consultation with Dr. Devona Konig.  BRIEF SUMMARY OF HOSPITAL COURSE: Ms. Armistead is an 81 year old very pleasant Caucasian female with  past medical history of severe peripheral neuropathy, who is mainly wheelchair-bound, recent right lower extremity fracture and recent non-Q-wave MI with medical treatment, and ischemic cardiomyopathy with chronic wound ulcers, who comes in with weakness and hypoxia. She was admitted with:   1. Generalized weakness, likely due to her pneumonia. She is mainly wheelchair-bound due to her right foot fracture, leg ulcers and chronic severe peripheral neuropathy.  2. Acute hypoxic respiratory failure due to bilateral pneumonia along with acute on chronic congestive heart failure. The patient started receiving IV Lasix. She diuresed well. Her Lasix was thereafter discontinued. In light of her being recently hospitalized in December, she was treated for hospital-acquired pneumonia. She was on Zosyn, vancomycin and Levaquin. It was changed to p.o. cefuroxime and Levaquin once the family decided to take the patient home. Home oxygen was arranged. Her blood cultures remained negative.  3. Multiple wounds, lower extremity, with chronic ulcers. Wound care consultation was done.  4. Bilateral severe peripheral neuropathy. Continue Neurontin. The patient is wheelchair-bound.  5. Atrial fibrillation noted on EKG, which is new. Cardiology consultation was obtained with Dr. Rockey Situ. The patient has history of recent non-Q-wave myocardial infarction after her right ankle fracture in December 2014. The patient was, at that time, treated medically, since the patient was not able to lie flat for her cardiac catheterization. The patient was a risk for anticoagulation due to fall risk. She was treated with low-dose metoprolol for her atrial fibrillation.  6. Lovenox was given  for deep vein thrombosis prophylaxis.   DISCHARGE PLANNING: We had a lengthy conversation with the patient, the patient's daughter, who is the healthcare power of attorney, Butch Penny. They met with palliative care team, and they were aware the patient is not  doing well. However, the patient's mind is set on going home to be with her husband and dog. The patient was seen by hospice and made arrangements for hospital bed and oxygen. Overall, the patient's family does understand the patient has a poor prognosis.   Hospital stay remained otherwise stable.   CODE STATUS: The patient remained a no code, DNR.   TIME SPENT: 40 minutes.  ____________________________ Hart Rochester Posey Pronto, MD sap:lb D: 09/11/2013 06:44:36 ET T: 09/11/2013 07:33:56 ET JOB#: [id number]  cc: Jomarie Gellis A. Posey Pronto, MD, <Dictator> Einar Pheasant, MD Efraim Kaufmann, MD Minna Merritts, MD Allyne Gee, MD Ilda Basset MD ELECTRONICALLY SIGNED 09/15/2013 13:40

## 2014-11-08 NOTE — Consult Note (Signed)
General Aspect Ms. Cozza is a 81yo female w/ PMHx s/f suspected ischemic cardiomyopathy (EF 45-50%), recent NSTEMI, idiopathic peripheral neuropathy, recent fall 06/2013 w/ resultant R ankle fracture (no surgery per ortho), sacral and leg ulcers, and GERD who was admitted to [facility] yesterday w/ acute hypoxic respiratory failure. Cardiology consulted for management of recent NSTEMI, CHF, ? atrial fibrillation.   The patient is wheelchair bound. She had experienced a mechanical fall 06/2013 before bed after getting out of her wheelchair and spent most of the night on the floor before she was able to contact her daughter and eventually present to the ED. There, she was noted to have a R ankle fracture. CK was elevated at 4K supporting mild rhabdomyolisis. Troponin elevated to 14. She denied ischemic symptoms. Cath deferred initially to determine if surgery needed for R ankle fracture. Ortho felt surgery not needed as fracture non-displaced. She had developed a bronchitis w/ cough during this time and was unable to lay flat. The decision was made to bring the patient back for outpt cath. Started on ASA/Plavix. She has been unable to follow-up w/ [place] due to financial reasons. She has continued to feel "under the weather" w/ persistent cough per telephone notes.  2D echo 07/18/13: EF 45-50%, impaired LV diastolic relaxation, lateral wall HK, mild AI, mild dilatation of ascending aorta, mild TR  She reports experiencing progressively worsening dyspnea, nonproductive cough, weakness, fatigue and lethargy over the past 1-2 weeks. She denies chest pain, palpitations or syncope during this time. Occasional PND, stable orthopnea. No weight changes or LE edema. Dyspnea worsened and she presented to [facility] ED.   Present Illness In the ED, O2 sat 85% on RA. Question of atrial fibrillation- EKG revealed motion artifact, fairly regular, narrow complex rhythm w/ intermittent P waves. HR 87 bpm. Inferior IVCD, no ST/T changes.  Initial TnI WNL. BNP 949. Na 130, K 3.2. WBC 15.6. CXR-  diffuse interstitial and airspace disease bilaterally, L>R, most concerning for infection, particularly in the LUL; atherosclerosis. Noncontrast head CT- global atrophy, lacunar infarcts, no acute process. She was admitted by the medicine team and treated for HCAP. She was given Lasix IV w/ -500 mL I/O. TSH WNL.  PAST MEDICAL HISTORY: Significant for:  1. Idiopathic peripheral neuropathy, wheelchair-bound due to chronic neuropathy.  2. Recent ankle fracture.  3. Recent history of MI.  4. Hypertension.  5. Restless leg syndrome.  6. GERD.  7. Diastolic dysfunction.   PAST SURGICAL HISTORY: Status post cataract surgery, status post appendectomy and bilateral knee surgery, mole removed from the abdomen, great toe fusion on the right side.   SOCIAL HISTORY: Does not smoke. Does not drink. Lives with her husband.   FAMILY HISTORY: Positive for coronary artery disease in her son, who had CABG at age 19. Father had 3 MIs. Mother had breast cancer.   Physical Exam:  GEN no acute distress, disheveled   HEENT pink conjunctivae, PERRL, hearing intact to voice   NECK supple  No masses  trachea midline   RESP mildly increased respiratory effort, speaking in short sentences, scattered rales, no wheezing, intermittent rhonchi   CARD Regular rate and rhythm  Normal, S1, S2  II/VI systolic murmur at apex radiating to L axilla   ABD denies tenderness  soft  normal BS   EXTR negative cyanosis/clubbing, posterior bilateral LE bandages appreciated, LEs tender to palpation, no erythema or pitting edema   SKIN normal to palpation, positive ulcers   NEURO follows commands, motor/sensory function intact  PSYCH alert, A+O to time, place, person   Review of Systems:  Subjective/Chief Complaint shortness of breath   General: Fatigue  Weakness   Respiratory: Frequent cough  Short of breath   Cardiovascular: Dyspnea   Gastrointestinal: No  Complaints   Review of Systems: All other systems were reviewed and found to be negative   Home Medications: Medication Instructions Status  aspirin 81 mg oral delayed release tablet 1 tab(s) orally once a day Active  clopidogrel 75 mg oral tablet 1 tab(s) orally once a day Active  metoprolol tartrate 25 mg oral tablet 1 tab(s) orally 2 times a day Active  aMILoride-hydrochlorothiazide 5 mg-50 mg oral tablet 1 tab(s) orally once a day Active  acetaminophen-oxyCODONE 325 mg-5 mg oral tablet 1 tab(s) orally once a day (at bedtime) Active  prednisoLONE ophthalmic acetate 1% ophthalmic suspension 1 drop(s) to operative eye 4 times a day Active  rOPINIRole 3 mg oral tablet 1 tab(s) orally once a day (at bedtime) Active  diazepam 5 mg oral tablet 1 tab(s) orally 2 times a day, As Needed - for Pain Active  omeprazole 20 mg oral delayed release capsule 1 cap(s) orally 2 times a day Active  ramipril 10 mg oral capsule 1 cap(s) orally once a day Active  traMADol 50 mg oral tablet 1-2 tab(s) orally every 6 hours, As Needed - for Pain Active  gabapentin 400 mg oral capsule 1 cap(s) orally once a day (in the morning), 1 capsule every afternoon, and 3 capsules at bedtime Active  Klor-Con 8 mEq oral tablet, extended release 1 tab(s) orally once a day Active  timolol ophthalmic 0.5% ophthalmic solution 1 drop(s) to each affected eye OS 2 times a day  TIMOLOL MALEATE Active   Lab Results:  Thyroid:  20-Feb-15 04:29   Thyroid Stimulating Hormone 0.493 (0.45-4.50 (International Unit)  ----------------------- Pregnant patients have  different reference  ranges for TSH:  - - - - - - - - - -  Pregnant, first trimetser:  0.36 - 2.50 uIU/mL)  Routine Chem:  19-Feb-15 11:20   Sodium, Serum  130  Potassium, Serum  3.2  B-Type Natriuretic Peptide ([facility])  949 (Result(s) reported on 05 Sep 2013 at 02:00PM.)  20-Feb-15 04:29   Sodium, Serum  131  Potassium, Serum  3.3  Cardiac:  19-Feb-15 11:20    Troponin I 0.05 (0.00-0.05 0.05 ng/mL or less: NEGATIVE  Repeat testing in 3-6 hrs  if clinically indicated. >0.05 ng/mL: POTENTIAL  MYOCARDIAL INJURY. Repeat  testing in 3-6 hrs if  clinically indicated. NOTE: An increase or decrease  of 30% or more on serial  testing suggests a  clinically important change)   EKG:  Interpretation NSR, 1st degree AVB, sinus arrhythmia, RAD, no ST/T changes   Rate 87   Radiology Results: XRay:    19-Feb-15 13:29, Chest Portable Single View  Chest Portable Single View   REASON FOR EXAM:    Weakness  COMMENTS:       PROCEDURE: DXR - DXR PORTABLE CHEST SINGLE VIEW  - Sep 05 2013  1:29PM     CLINICAL DATA:  Weakness.    EXAM:  PORTABLE CHEST - 1 VIEW    COMPARISON:  One-view chest 07/18/2013    FINDINGS:  Heart size isnormal. Diffuse interstitial now airspace disease has  increased since prior study, left greater than right. The lung  volumes remain low. The visualized soft tissues and bony thorax are  unremarkable.     IMPRESSION:  1. Diffuse interstitial  and airspace disease bilaterally, left  greater than right. While this may represent edema, it is most  concerning infection, particularly in the left upper lobe.  2. Progressive low lung volumes.  3. Atherosclerosis.      Electronically Signed    ByBarbera Setters: ChrisMattern M.D.    On: 09/05/2013 13:37     Verified By: Jamesetta OrleansHRISTOPHER W. MATTERN, M.D.,  CT:    19-Feb-15 11:56, CT Head Without Contrast  CT Head Without Contrast   REASON FOR EXAM:    decreased LOC, weakness  COMMENTS:       PROCEDURE: CT  - CT HEAD WITHOUT CONTRAST  - Sep 05 2013 11:56AM     CLINICAL DATA:  Decreased level of consciousness.  Weakness.    EXAM:  CT HEAD WITHOUT CONTRAST    TECHNIQUE:  Contiguous axial images were obtained from the base of the skull  through the vertex without intravenous contrast.    COMPARISON:  None.  FINDINGS:  A remote lacunar infarct is present in the posterior left  putamen.  Moderate generalized atrophy and white matter disease is present  bilaterally. No acute cortical infarct, hemorrhage, or mass lesion  is present. Ventricles are proportionate to the degree of atrophy.  No significant extra-axial fluid collection is present.  Atherosclerotic calcifications present within the cavernous carotid  arteries through the proximal M1 segments bilaterally.    Minimal fluid is present and posterior right ethmoid air cells. The  paranasal sinuses and mastoid air cells are otherwise clear. The  osseous skull is intact.    IMPRESSION:  1. No acute intracranial abnormality.  2. Moderate atrophy and white matter disease bilaterally.  3. Remote lacunar infarct of the posterior left putamen.      Electronically Signed    By: Gennette Pachris  Mattern M.D.    On: 09/05/2013 12:02         Verified By: Jamesetta OrleansHRISTOPHER W. MATTERN, M.D.,    Duragesic: GI Distress  Codeine: Other  Neo-Synephrine Nasal: Other  Sulfa drugs: Unknown  IVP Dye: Unknown  Barbiturates: Unknown  Garlic: Unknown  Vital Signs/Nurse's Notes: **Vital Signs.:   20-Feb-15 08:53  Vital Signs Type Pre Medication  Pulse Pulse 84  Systolic BP Systolic BP 87  Diastolic BP (mmHg) Diastolic BP (mmHg) 57  Mean BP 67  *Intake and Output.:   Daily 20-Feb-15 07:00  Grand Totals Intake:  300 Output:  800    Net:  -500 24 Hr.:  -500  IV (Secondary)      In:  300  Urine ml     Out:  800  Length of Stay Totals Intake:  300 Output:  800    Net:  -500    Impression 81yo female w/ PMHx s/f suspected ischemic cardiomyopathy (EF 45-50%), recent NSTEMI, idiopathic peripheral neuropathy, recent fall 06/2013 w/ resultant R ankle fracture (no surgery per ortho) and GERD who was [facility] yesterday w/ acute hypoxic respiratory failure.  1. Acute hypoxic respiratory failure "Breathing better today." O2 90-97% on n/c, RR 16-20. Leukocytosis down-trending. Afebrile. Sourced from HCAP, small component of  CHF. Started on BS abx, prednisone, received Lasix. Sputum cx pending.  -- HCAP management per primary team -- Continued volume monitoring  2. Acute on chronic combined CHF EF 45-50%, diastolic dysfunction, lateral wall HK on echo last month in the setting of NSTEMI. Suspect ischemic cardiomyopathy. Mildly decompensated this admission due to HCAP, demand ischemia. Has not yet undergone cath for revascularization options.  -- Continue ACEi, BB -- Would  stop amiloride-HCTZ combo, consider small dose PO Lasix -- Continue to monitor I/Os, daily weights -- Na/fluid restriction -- Treat underlying infection  3. Recent NSTEMI TnI elevated to 14 last month in the setting of fall, traumatic R ankle fracture. Denies chest pain. Functionally limited at home- wheelchair bound. Started on ASA/Plavix/ACEi/BB/NTG SL PRN w/ plans for outpatient cath +/- PCI. She has been unable to follow-up to proceed.  -- Obtain full set of cardiac biomarkers -- Treat HCAP, pressure ulcers -- Proceed w/ cardiac catheterization this admission once respiratory status improved -- Continue ASA, Plavix, ACEi, BB, NTG SL PRN -- Check LFTs- statin not started last admission due to elevated AST. If WNL, start high-potency statin.   4. Arrhythmia EKG and telemetry most consistent with NSR, 1st degree AVB, occasional PACs, possibly a component of sinus arrhythmia over atrial fibrillation. TSH WNL. K 3.3 this AM.  -- Replete K -- Check Mg -- Avoid hypoxia  5. Pressure ulcers -- Wound care following   Plan 6. R ankle fracture Non-displaced, conservatively managed. No surgery recommended per ortho last admission.   7. Hypokalemia -- Replete  8. Hyponatremia Improving w/ diuresis.  -- Continue to monitor  9. Thrombocytosis PLT 654 this AM, 700 yesterday. New since last admission. ?reactive from infection.  -- Continue to follow w/ ongiong HCAP treatment  10. Normocytic anemia -- Continue to follow.   Electronic  Signatures for Addendum Section:  Lorine Bears (MD) (Signed Addendum [phone] 13:29)  The patient was seen and examined. Agree with the above. She had a NSTEMI in December. Outpatient cath was planned but could no be done. She presented with dyspnea and cough. She was diagnosed with pneumonia  and mild ehart failure. She received Lasix and LEvaquin. She is feeling better.  She appears to be euvolemic now. Continue current treatment. Plan cardiac cath on Monday if respiratory status is better.   Electronic Signatures: Gery Pray (PA-C)  (Signed 20-Feb-15 09:38)  Authored: General Aspect/Present Illness, History and Physical Exam, Review of System, Home Medications, Labs, EKG , Radiology, Allergies, Vital Signs/Nurse's Notes, Impression/Plan Lorine Bears (MD)  (Signed 20-Feb-15 13:29)  Co-Signer: General Aspect/Present Illness, History and Physical Exam, Review of System, Home Medications, Labs, EKG , Radiology, Allergies, Vital Signs/Nurse's Notes, Impression/Plan   Last Updated: 20-Feb-15 13:29 by Lorine Bears (MD)

## 2014-11-08 NOTE — Consult Note (Signed)
   Comments   I met with pt's daughter and granddaughter. Family say they understand that pt is at riskf for continued decline and could be approaching end of life. However, family also would like to take her home with hospice care. Family feel that patient may do better at home although if she declines it would be easier to ensure her comfort.  screening  Electronic Signatures for Addendum Section:  Phifer, Izora Gala (MD) (Signed Addendum 23-Feb-15 13:54)  Discussed with Billey Chang, NP, in detail. Agree with assessment and plan as outlined in above note.   Electronic Signatures: Borders, Kirt Boys (NP)  (Signed 23-Feb-15 11:18)  Authored: Palliative Care   Last Updated: 23-Feb-15 13:54 by Phifer, Izora Gala (MD)

## 2016-02-01 IMAGING — CT CT HEAD WITHOUT CONTRAST
3 series · 17 of 30 positions shown, 19 images · non-contrast
Comparison: None.

CLINICAL DATA: Decreased level of consciousness.  Weakness.

EXAM:
CT HEAD WITHOUT CONTRAST
TECHNIQUE: Contiguous axial images were obtained from the base of the skull
through the vertex without intravenous contrast.

[Series 3: head wo · axial · 0.39mm/px · z∈[-28,+80]mm · 8 of 32 slices shown, 10 images (1 of 2)]
[im 4/32  brain]
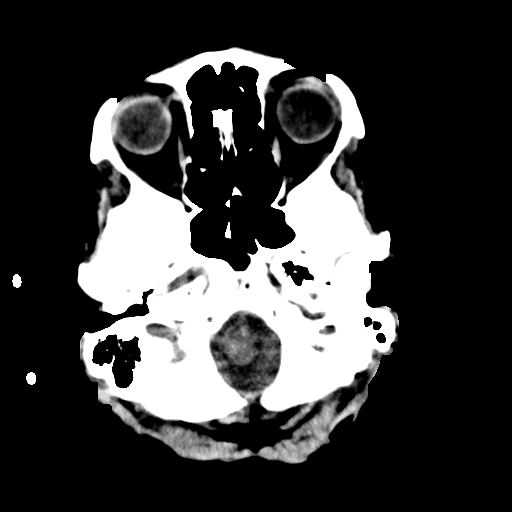
[im 4/32  bone]
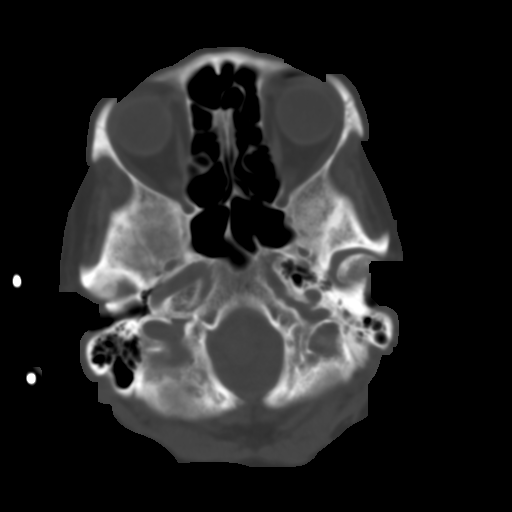
[im 7/32  brain]
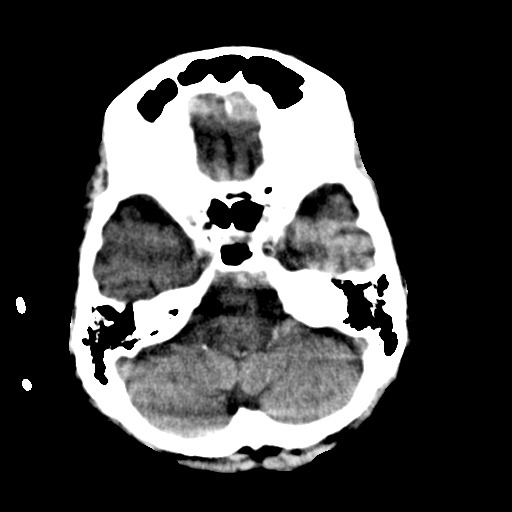
[im 11/32  brain]
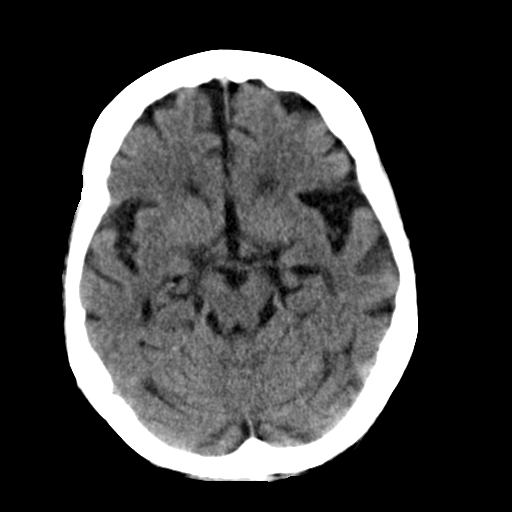
[im 14/32  brain]
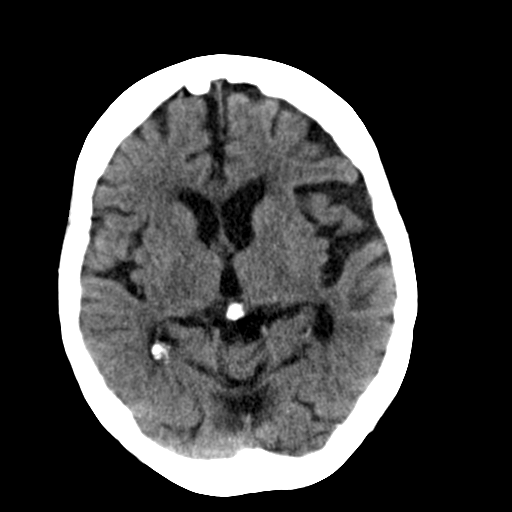
[im 18/32  brain]
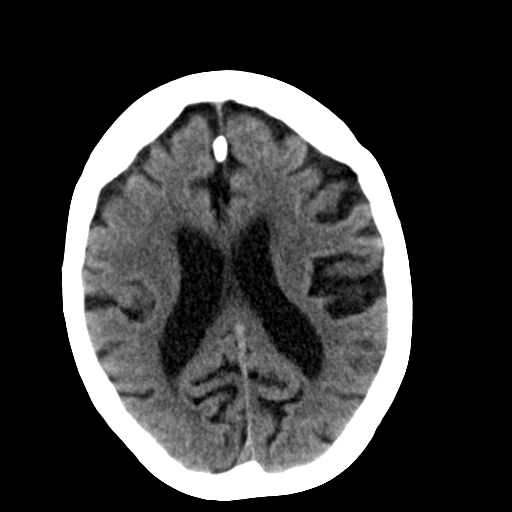
[im 18/32  bone]
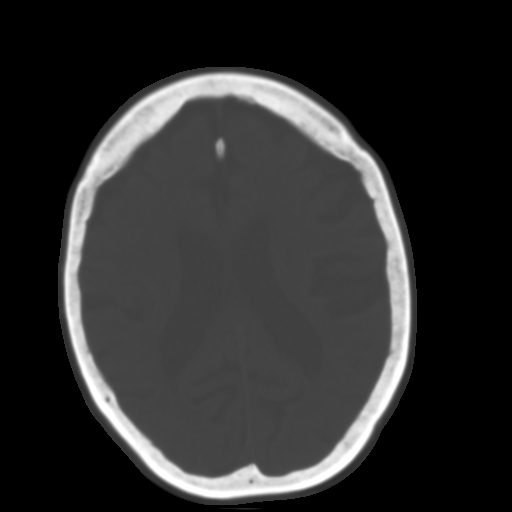
[im 21/32  brain]
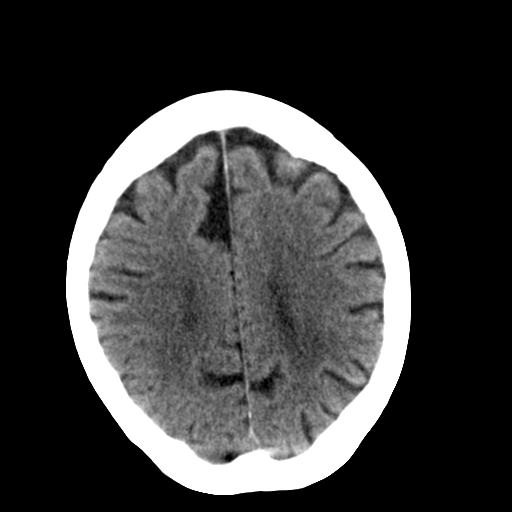
[im 25/32  brain]
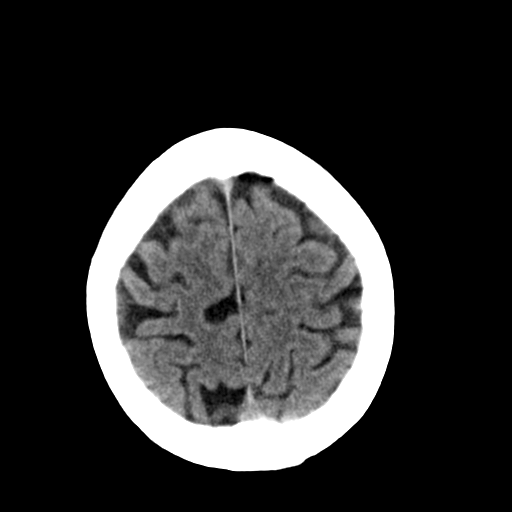
[im 28/32  brain]
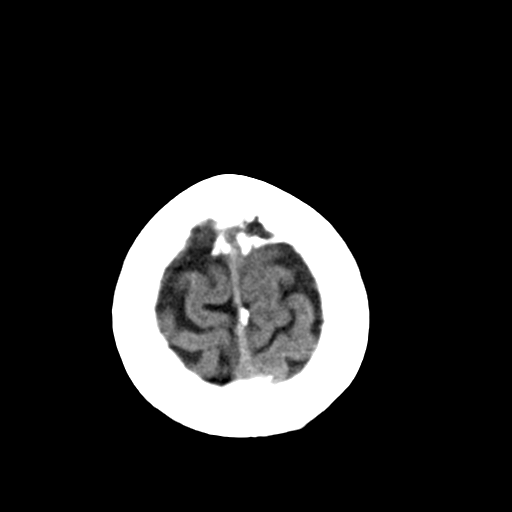

[Series 4: head wo · axial · 0.42mm/px · z∈[-38,+2]mm · 3 of 16 slices shown (2 of 2)]
[im 4/16  brain]
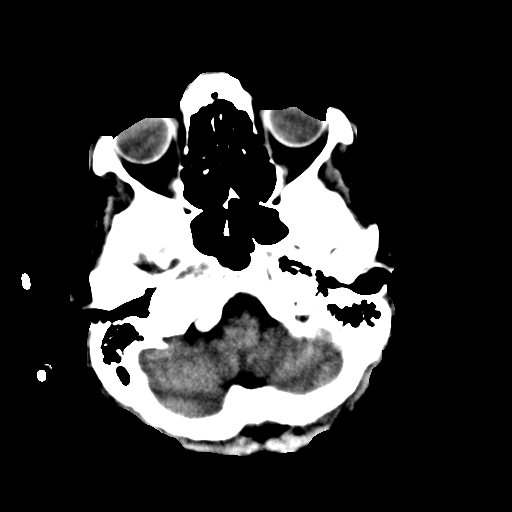
[im 8/16  brain]
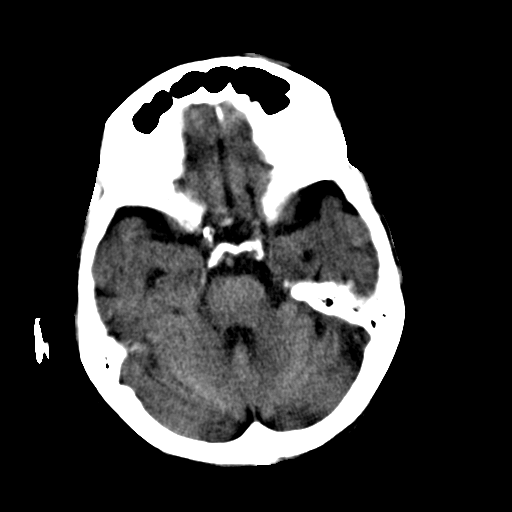
[im 12/16  brain]
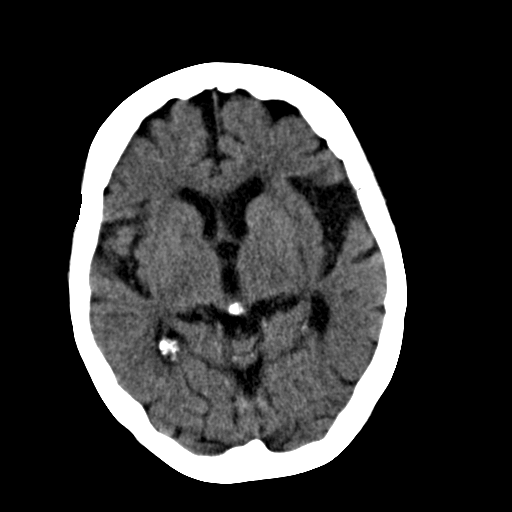

[Series 5: head bone · axial · 0.42mm/px · z∈[-53,-1]mm · 6 of 47 slices shown]
[im 4/47  bone]
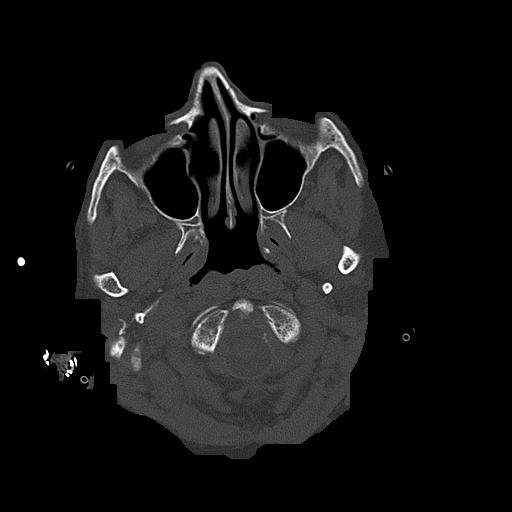
[im 10/47  bone]
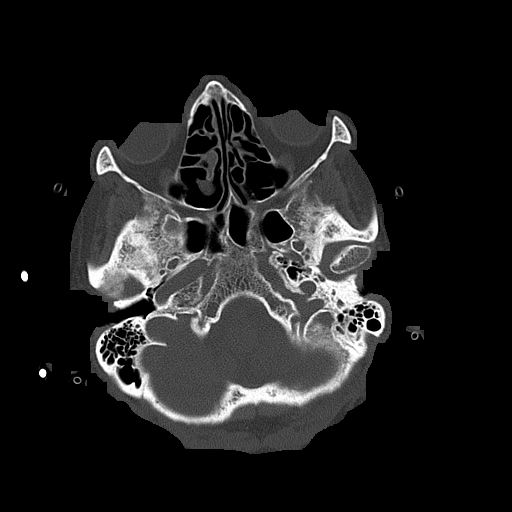
[im 17/47  bone]
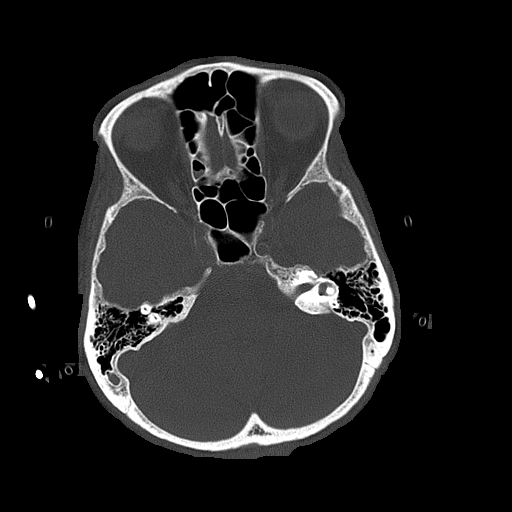
[im 20/47  bone]
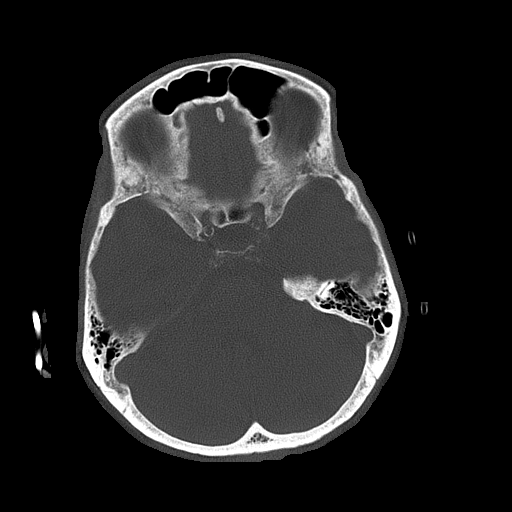
[im 27/47  bone]
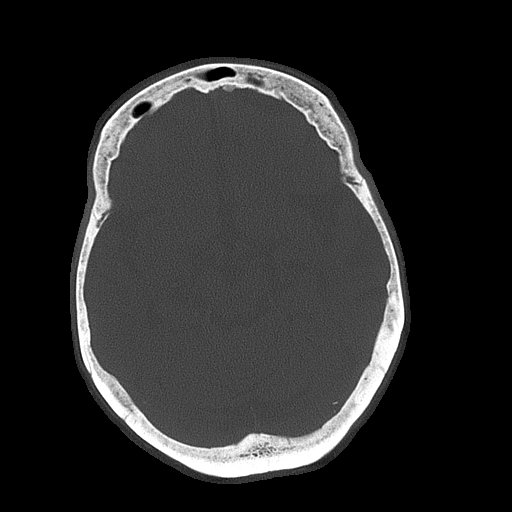
[im 30/47  bone]
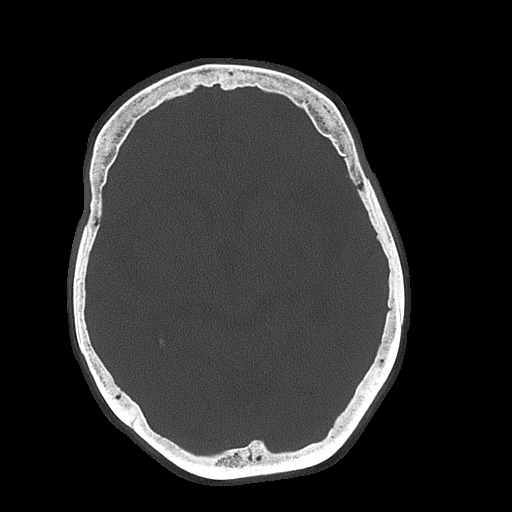

[17 of 30 positions shown; findings below may reference images not displayed]

FINDINGS: A remote lacunar infarct is present in the posterior left putamen.
Moderate generalized atrophy and white matter disease is present
bilaterally. No acute cortical infarct, hemorrhage, or mass lesion
is present. Ventricles are proportionate to the degree of atrophy.
No significant extra-axial fluid collection is present.
Atherosclerotic calcifications present within the cavernous carotid
arteries through the proximal M1 segments bilaterally.

Minimal fluid is present and posterior right ethmoid air cells. The
paranasal sinuses and mastoid air cells are otherwise clear. The
osseous skull is intact.
IMPRESSION: 1. No acute intracranial abnormality.
2. Moderate atrophy and white matter disease bilaterally.
3. Remote lacunar infarct of the posterior left putamen.
